# Patient Record
Sex: Male | Born: 1965 | Race: Black or African American | Hispanic: No | Marital: Married | Smoking: Never smoker
Health system: Southern US, Community
[De-identification: ages and names within clinical notes are randomized; demographics above are authoritative.]

## PROBLEM LIST (undated history)

## (undated) DIAGNOSIS — M109 Gout, unspecified: Secondary | ICD-10-CM

## (undated) HISTORY — PX: KNEE SURGERY: SHX244

## (undated) HISTORY — PX: NOSE SURGERY: SHX723

---

## 2015-05-14 ENCOUNTER — Encounter (HOSPITAL_COMMUNITY): Payer: Self-pay | Admitting: Emergency Medicine

## 2015-05-14 ENCOUNTER — Emergency Department (HOSPITAL_COMMUNITY)
Admission: EM | Admit: 2015-05-14 | Discharge: 2015-05-15 | Disposition: A | Discharge status: Home / Self Care | Payer: Self-pay | Attending: Emergency Medicine | Admitting: Emergency Medicine

## 2015-05-14 DIAGNOSIS — E860 Dehydration: Secondary | ICD-10-CM | POA: Insufficient documentation

## 2015-05-14 DIAGNOSIS — R42 Dizziness and giddiness: Secondary | ICD-10-CM

## 2015-05-14 DIAGNOSIS — Z8739 Personal history of other diseases of the musculoskeletal system and connective tissue: Secondary | ICD-10-CM | POA: Insufficient documentation

## 2015-05-14 DIAGNOSIS — M542 Cervicalgia: Secondary | ICD-10-CM | POA: Insufficient documentation

## 2015-05-14 HISTORY — DX: Gout, unspecified: M10.9

## 2015-05-14 NOTE — ED Provider Notes (Signed)
CSN: [id number]   Arrival date & time 05/14/15 2335  History  By signing my name below, I, Bethel Born, attest that this documentation has been prepared under the direction and in the presence of Shon Baton, MD. Electronically Signed: Bethel Born, ED Scribe. 05/15/2015. 5:43 AM.  Chief Complaint  Patient presents with  . Nausea  . Dizziness    HPI The history is provided by the patient. No language interpreter was used.   Pedro Bailey is a 49 y.o. male who presents to the Emergency Department complaining of intermittent dizziness (off-balance) with onset 1.5-2 weeks week ago. Denies room spinning dizziness. His most recent episode was tonight at a football game and he notes that it was the worse one so far. The sensation is worse with moving the head and transitioning from sitting to standing.  Associated symptoms include  nausea, 1 episode of emesis tonight, blurred vision, and diffuse muscle spasming. He also complains of neck stiffness that he associates with lifting weights. He saw a chiropractor last month where he had an adjustment. Over the last week he has had some sinus congestion.  Pt denies recent trauma, blurred vision, numbness, tingling, and focal weakness.   Past Medical History  Diagnosis Date  . Gout     Past Surgical History  Procedure Laterality Date  . Knee surgery    . Nose surgery      No family history on file.  Social History  Substance Use Topics  . Smoking status: Never Smoker   . Smokeless tobacco: None  . Alcohol Use: No     Review of Systems  Constitutional: Negative.  Negative for fever.  Respiratory: Negative.  Negative for chest tightness and shortness of breath.   Cardiovascular: Negative.  Negative for chest pain.  Gastrointestinal: Positive for nausea and vomiting. Negative for abdominal pain.  Genitourinary: Negative.   Musculoskeletal: Positive for neck pain. Negative for back pain.  Neurological: Positive for dizziness.  Negative for headaches.  All other systems reviewed and are negative.  Home Medications   Prior to Admission medications   Medication Sig Start Date End Date Taking? Authorizing Provider  meclizine (ANTIVERT) 50 MG tablet Take 0.5 tablets (25 mg total) by mouth 3 (three) times daily as needed for dizziness. 05/15/15   Shon Baton, MD    Allergies  Review of patient's allergies indicates no known allergies.  Triage Vitals: BP 133/80 mmHg  Pulse 67  Temp(Src) 97.7 F (36.5 C) (Oral)  Resp 15  SpO2 100%  Physical Exam  Constitutional: He is oriented to person, place, and time. He appears well-developed and well-nourished. No distress.  Muscular build  HENT:  Head: Normocephalic and atraumatic.  Mouth/Throat: Oropharynx is clear and moist.  Eyes: EOM are normal. Pupils are equal, round, and reactive to light.  Neck: Normal range of motion. Neck supple.  Tenderness to palpation of the bilateral paraspinous muscle region of the neck  Cardiovascular: Normal rate, regular rhythm and normal heart sounds.   No murmur heard. Pulmonary/Chest: Effort normal and breath sounds normal. No respiratory distress. He has no wheezes.  Abdominal: Soft. Bowel sounds are normal. There is no tenderness. There is no rebound.  Musculoskeletal: He exhibits no edema.  Lymphadenopathy:    He has no cervical adenopathy.  Neurological: He is alert and oriented to person, place, and time.  Cranial nerves II through XII intact, 5 out of 5 strength in all 4 extremities, no dysmetria to finger-nose-finger  Skin: Skin is warm  and dry.  Psychiatric: He has a normal mood and affect.  Nursing note and vitals reviewed.   ED Course  Procedures   DIAGNOSTIC STUDIES: Oxygen Saturation is 100% on RA, normal by my interpretation.    COORDINATION OF CARE: 1:05 AM Discussed treatment plan which includes lab work, EKG, and CTA of the head/neck with pt at bedside and pt agreed to plan.  Labs Reviewed  CBC  WITH DIFFERENTIAL/PLATELET - Abnormal; Notable for the following:    WBC 14.5 (*)    Neutro Abs 12.7 (*)    All other components within normal limits  BASIC METABOLIC PANEL - Abnormal; Notable for the following:    Glucose, Bld 132 (*)    Creatinine, Ser 1.29 (*)    All other components within normal limits    Imaging Review Ct Angio Head W/cm &/or Wo Cm  05/15/2015   CLINICAL DATA:  Intermittent dizziness and neck pain for 2 weeks. Acute onset dizziness and nausea while at football game tonight. Vomiting.  EXAM: CT ANGIOGRAPHY HEAD AND NECK  TECHNIQUE: Multidetector CT imaging of the head and neck was performed using the standard protocol during bolus administration of intravenous contrast. Multiplanar CT image reconstructions and MIPs were obtained to evaluate the vascular anatomy. Carotid stenosis measurements (when applicable) are obtained utilizing NASCET criteria, using the distal internal carotid diameter as the denominator.  CONTRAST:  50mL OMNIPAQUE IOHEXOL 350 MG/ML SOLN  COMPARISON:  None.  FINDINGS: CT HEAD  The ventricles and sulci are normal. No intraparenchymal hemorrhage, mass effect nor midline shift. No acute large vascular territory infarcts. No abnormal intracranial enhancement.  No abnormal extra-axial fluid collections. Basal cisterns are patent. Trace calcific atherosclerosis of the carotid siphons.  No skull fracture. The included ocular globes and orbital contents are non-suspicious. The mastoid aircells and included paranasal sinuses are well-aerated.  CTA NECK  Normal appearance of the thoracic arch, Common origin of the innominate and LEFT subclavian artery. The origins of the innominate, left Common carotid artery and subclavian artery are widely patent.  Bilateral Common carotid arteries are widely patent, coursing in a straight line fashion. Normal appearance of the carotid bifurcations without hemodynamically significant stenosis by NASCET criteria. Trace calcific  atherosclerosis the RIGHT carotid bulb. Normal appearance of the included internal carotid arteries.  LEFT vertebral artery arises directly from the aortic arch, normal variant. RIGHT vertebral artery is dominant. Normal appearance of the vertebral arteries, which appear widely patent.  No dissection, no pseudoaneurysm. No abnormal luminal irregularity. No contrast extravasation.  Soft tissues are unremarkable. 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 Moderate to severe canal stenosis at C3.  CTA HEAD  Anterior circulation: Normal appearance of the cervical internal carotid arteries, petrous, cavernous and supra clinoid internal carotid arteries. Widely patent anterior communicating artery. Normal appearance of the anterior and middle cerebral arteries.  Posterior circulation: Normal appearance of the vertebral arteries, vertebrobasilar junction and basilar artery, as well as main branch vessels. LEFT vertebral artery terminates in the posterior inferior cerebellar artery. Normal appearance of the posterior cerebral arteries. Fetal origin bilateral posterior communicating arteries.  No large vessel occlusion, hemodynamically significant stenosis, dissection, luminal irregularity, contrast extravasation or aneurysm within the anterior nor posterior circulation.  IMPRESSION: CT HEAD: No acute intracranial process ; normal noncontrast CT head. No abnormal intracranial enhancement.  CTA NECK: No hemodynamically significant stenosis or  acute vascular process.  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 Severe LEFT C6-7 neural foraminal narrowing.  CTA HEAD: No acute large vessel occlusion or high-grade stenosis. Complete circle of Willis.   Electronically Signed   By:  Awilda Metro M.D.   On: 05/15/2015 02:25   Ct Angio Neck W/cm &/or Wo/cm  05/15/2015   CLINICAL DATA:  Intermittent dizziness and neck pain for 2 weeks. Acute onset dizziness and nausea while at football game tonight. Vomiting.  EXAM: CT ANGIOGRAPHY HEAD AND NECK  TECHNIQUE: Multidetector CT imaging of the head and neck was performed using the standard protocol during bolus administration of intravenous contrast. Multiplanar CT image reconstructions and MIPs were obtained to evaluate the vascular anatomy. Carotid stenosis measurements (when applicable) are obtained utilizing NASCET criteria, using the distal internal carotid diameter as the denominator.  CONTRAST:  50mL OMNIPAQUE IOHEXOL 350 MG/ML SOLN  COMPARISON:  None.  FINDINGS: CT HEAD  The ventricles and sulci are normal. No intraparenchymal hemorrhage, mass effect nor midline shift. No acute large vascular territory infarcts. No abnormal intracranial enhancement.  No abnormal extra-axial fluid collections. Basal cisterns are patent. Trace calcific atherosclerosis of the carotid siphons.  No skull fracture. The included ocular globes and orbital contents are non-suspicious. The mastoid aircells and included paranasal sinuses are well-aerated.  CTA NECK  Normal appearance of the thoracic arch, Common origin of the innominate and LEFT subclavian artery. The origins of the innominate, left Common carotid artery and subclavian artery are widely patent.  Bilateral Common carotid arteries are widely patent, coursing in a straight line fashion. Normal appearance of the carotid bifurcations without hemodynamically significant stenosis by NASCET criteria. Trace calcific atherosclerosis the RIGHT carotid bulb. Normal appearance of the included internal carotid arteries.  LEFT vertebral artery arises directly from the aortic arch, normal variant. RIGHT vertebral artery is dominant. Normal appearance of the vertebral arteries, which appear widely patent.  No  dissection, no pseudoaneurysm. No abnormal luminal irregularity. No contrast extravasation.  Soft tissues are unremarkable. 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 Moderate to severe canal stenosis at C3.  CTA HEAD  Anterior circulation: Normal appearance of the cervical internal carotid arteries, petrous, cavernous and supra clinoid internal carotid arteries. Widely patent anterior communicating artery. Normal appearance of the anterior and middle cerebral arteries.  Posterior circulation: Normal appearance of the vertebral arteries, vertebrobasilar junction and basilar artery, as well as main branch vessels. LEFT vertebral artery terminates in the posterior inferior cerebellar artery. Normal appearance of the posterior cerebral arteries. Fetal origin bilateral posterior communicating arteries.  No large vessel occlusion, hemodynamically significant stenosis, dissection, luminal irregularity, contrast extravasation or aneurysm within the anterior nor posterior circulation.  IMPRESSION: CT HEAD: No acute intracranial process ; normal noncontrast CT head. No abnormal intracranial enhancement.  CTA NECK: No hemodynamically significant stenosis or acute vascular process.  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 Severe LEFT C6-7 neural foraminal narrowing.  CTA HEAD: No acute large vessel occlusion or high-grade stenosis. Complete circle of Willis.   Electronically Signed   By: Awilda Metro M.D.   On: 05/15/2015 02:25    EKG Interpretation  Date/Time:  Thursday May 14 2015 23:42:55 EDT Ventricular Rate:  65 PR Interval:  151 QRS Duration: 84 QT Interval:  399 QTC Calculation: 415 R Axis:   72 Text Interpretation:  Sinus rhythm Nonspecific T  abnormalities, inferior leads No prior for comparison Confirmed by HORTON  MD, COURTNEY ([id number]) on 05/15/2015 12:15:58 AM    MDM   Final diagnoses:  Dizziness  Dehydration   Patient presents with dizziness. Reports episode tonight worse with head movement, associated neck pain, and vomiting. Nontoxic on exam. Neurologically intact. Patient does report recent chiropractic visit and given neck pain, dissection would be consideration. However he is neurologically intact. Other considerations include dehydration or benign positional vertigo. Patient was given fluids. Not orthostatic. Lab work obtained and shows mildly elevated creatinine which could reflect mild dehydration. EKG shows no evidence of arrhythmia. CT imaging head and neck shows no evidence of arterial dissection.  He does have some stenosis of the spinal canal which is likely unrelated. On recheck, patient reports improvement of symptoms of fluid; however, he continues to have some dizziness. Patient was given an additional 1 L of fluids and Valium for vertigo. On recheck, patient reports resolution of symptoms. He is ambulatory at his baseline. Suspect combination of mild dehydration and BPPV.  Will discharge home with meclizine.  After history, exam, and medical workup I feel the patient has been appropriately medically screened and is safe for discharge home. Pertinent diagnoses were discussed with the patient. Patient was given return precautions.  I personally performed the services described in this documentation, which was scribed in my presence. The recorded information has been reviewed and is accurate.    Shon Baton, MD 05/15/15 [phone]

## 2015-05-14 NOTE — ED Notes (Signed)
Patient was at a football game tonight and all of a sudden started feeling dizzy and nauseated, vomited x1.  Patient states that the dizziness has been off and on for the last two weeks, the vomiting started tonight.  Patient is CAOX4.

## 2015-05-15 ENCOUNTER — Encounter (HOSPITAL_COMMUNITY): Payer: Self-pay | Admitting: Radiology

## 2015-05-15 ENCOUNTER — Emergency Department (HOSPITAL_COMMUNITY): Payer: Self-pay

## 2015-05-15 LAB — BASIC METABOLIC PANEL
ANION GAP: 11 (ref 5–15)
BUN: 10 mg/dL (ref 6–20)
CALCIUM: 9.4 mg/dL (ref 8.9–10.3)
CO2: 25 mmol/L (ref 22–32)
CREATININE: 1.29 mg/dL — AB (ref 0.61–1.24)
Chloride: 101 mmol/L (ref 101–111)
GFR calc Af Amer: >60 mL/min (ref >60)
GLUCOSE: 132 mg/dL — AB (ref 65–99)
Potassium: 4.6 mmol/L (ref 3.5–5.1)
Sodium: 137 mmol/L (ref 135–145)

## 2015-05-15 LAB — CBC WITH DIFFERENTIAL/PLATELET
BASOS ABS: 0 10*3/uL (ref 0.0–0.1)
BASOS PCT: 0 %
EOS PCT: 0 %
Eosinophils Absolute: 0 10*3/uL (ref 0.0–0.7)
HEMATOCRIT: 44 % (ref 39.0–52.0)
Hemoglobin: 15.1 g/dL (ref 13.0–17.0)
LYMPHS PCT: 8 %
Lymphs Abs: 1.2 10*3/uL (ref 0.7–4.0)
MCH: 31.3 pg (ref 26.0–34.0)
MCHC: 34.3 g/dL (ref 30.0–36.0)
MCV: 91.3 fL (ref 78.0–100.0)
MONO ABS: 0.5 10*3/uL (ref 0.1–1.0)
MONOS PCT: 4 %
NEUTROS ABS: 12.7 10*3/uL — AB (ref 1.7–7.7)
Neutrophils Relative %: 88 %
PLATELETS: 249 10*3/uL (ref 150–400)
RBC: 4.82 MIL/uL (ref 4.22–5.81)
RDW: 13.4 % (ref 11.5–15.5)
WBC: 14.5 10*3/uL — ABNORMAL HIGH (ref 4.0–10.5)

## 2015-05-15 IMAGING — CT CT ANGIO HEAD
1 of 12 series · 1 of 33 positions shown · IV contrast (Iohexol (Omnipaque 350))
Comparison: None.

CLINICAL DATA: Intermittent dizziness and neck pain for 2 weeks.
Acute onset dizziness and nausea while at football game tonight.
Vomiting.

EXAM:
CT ANGIOGRAPHY HEAD AND NECK
TECHNIQUE: Multidetector CT imaging of the head and neck was performed using
the standard protocol during bolus administration of intravenous
contrast. Multiplanar CT image reconstructions and MIPs were
obtained to evaluate the vascular anatomy. Carotid stenosis
measurements (when applicable) are obtained utilizing NASCET
criteria, using the distal internal carotid diameter as the
denominator.
CONTRAST:  50mL OMNIPAQUE IOHEXOL 350 MG/ML SOLN

[Series 300: locator · axial · 0.49mm/px · 1 of 1 slices shown]
[im 1/1  soft-tissue]
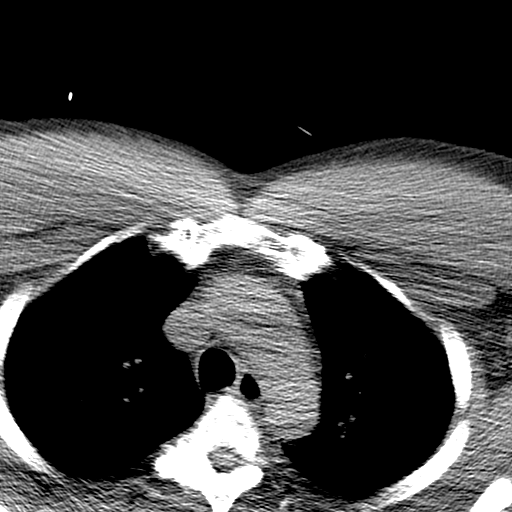

[1 of 33 positions shown; findings below may reference images not displayed]

FINDINGS: CT HEAD

The ventricles and sulci are normal. No intraparenchymal hemorrhage,
mass effect nor midline shift. No acute large vascular territory
infarcts. No abnormal intracranial enhancement.

No abnormal extra-axial fluid collections. Basal cisterns are
patent. Trace calcific atherosclerosis of the carotid siphons.

No skull fracture. The included ocular globes and orbital contents
are non-suspicious. The mastoid aircells and included paranasal
sinuses are well-aerated.

CTA NECK

Normal appearance of the thoracic arch, Common origin of the
innominate and LEFT subclavian artery. The origins of the
innominate, left Common carotid artery and subclavian artery are
widely patent.

Bilateral Common carotid arteries are widely patent, coursing in a
straight line fashion. Normal appearance of the carotid bifurcations
without hemodynamically significant stenosis by NASCET criteria.
Trace calcific atherosclerosis the RIGHT carotid bulb. Normal
appearance of the included internal carotid arteries.

LEFT vertebral artery arises directly from the aortic arch, normal
variant. RIGHT vertebral artery is dominant. Normal appearance of
the vertebral arteries, which appear widely patent.

No dissection, no pseudoaneurysm. No abnormal luminal irregularity.
No contrast extravasation.

Soft tissues are unremarkable. Multifocal ossified posterior
longitudinal ligament at the level of the vertebral bodies,
congenital canal stenosis resulting in at least moderate canal
stenosis C4 through C6-7, severe LEFT C6-7 neural foraminal
narrowing. Moderate to severe canal stenosis at C3.

CTA HEAD

Anterior circulation: Normal appearance of the cervical internal
carotid arteries, petrous, cavernous and supra clinoid internal
carotid arteries. Widely patent anterior communicating artery.
Normal appearance of the anterior and middle cerebral arteries.

Posterior circulation: Normal appearance of the vertebral arteries,
vertebrobasilar junction and basilar artery, as well as main branch
vessels. LEFT vertebral artery terminates in the posterior inferior
cerebellar artery. Normal appearance of the posterior cerebral
arteries. Fetal origin bilateral posterior communicating arteries.

No large vessel occlusion, hemodynamically significant stenosis,
dissection, luminal irregularity, contrast extravasation or aneurysm
within the anterior nor posterior circulation.
IMPRESSION: CT HEAD: No acute intracranial process ; normal noncontrast CT head.
No abnormal intracranial enhancement.

CTA NECK: No hemodynamically significant stenosis or acute vascular
process.

Segmental OPLL (ossified posterior longitudinal ligament) resulting
in moderate to severe canal stenosis at C3. Severe LEFT C6-7 neural
foraminal narrowing.

CTA HEAD: No acute large vessel occlusion or high-grade stenosis.
Complete circle of Willis.

## 2015-05-15 MED ORDER — SODIUM CHLORIDE 0.9 % IV BOLUS (SEPSIS)
1000.0000 mL | Freq: Once | INTRAVENOUS | Status: AC
Start: 2015-05-15 — End: 2015-05-15
  Administered 2015-05-15: 1000 mL via INTRAVENOUS

## 2015-05-15 MED ORDER — MECLIZINE HCL 50 MG PO TABS: 25.0000 mg | ORAL_TABLET | Freq: Three times a day (TID) | ORAL | Status: AC | PRN

## 2015-05-15 MED ORDER — DIAZEPAM 5 MG PO TABS
5.0000 mg | ORAL_TABLET | Freq: Once | ORAL | Status: AC
  Administered 2015-05-15: 5 mg via ORAL
  Filled 2015-05-15: qty 1

## 2015-05-15 MED ORDER — SODIUM CHLORIDE 0.9 % IV BOLUS (SEPSIS)
1000.0000 mL | Freq: Once | INTRAVENOUS | Status: AC
  Administered 2015-05-15: 1000 mL via INTRAVENOUS

## 2015-05-15 MED ORDER — IOHEXOL 350 MG/ML SOLN
50.0000 mL | Freq: Once | INTRAVENOUS | Status: AC | PRN
  Administered 2015-05-15: 50 mL via INTRAVENOUS

## 2015-05-15 MED ORDER — ONDANSETRON HCL 4 MG/2ML IJ SOLN
4.0000 mg | Freq: Once | INTRAMUSCULAR | Status: AC
  Administered 2015-05-15: 4 mg via INTRAVENOUS
  Filled 2015-05-15: qty 2

## 2015-05-15 NOTE — ED Notes (Signed)
Ambulated patient to bathroom. Pt was steady, but weak and reports dizziness and nausea.

## 2015-05-15 NOTE — Discharge Instructions (Signed)
You were seen today for dizziness. Some of your symptoms may be related to benign positional vertigo. However, you also appeared to be somewhat dehydrated.  Dehydration, Adult Dehydration is when you lose more fluids from the body than you take in. Vital organs like the kidneys, brain, and heart cannot function without a proper amount of fluids and salt. Any loss of fluids from the body can cause dehydration.  CAUSES   Vomiting.  Diarrhea.  Excessive sweating.  Excessive urine output.  Fever. SYMPTOMS  Mild dehydration  Thirst.  Dry lips.  Slightly dry mouth. Moderate dehydration  Very dry mouth.  Sunken eyes.  Skin does not bounce back quickly when lightly pinched and released.  Dark urine and decreased urine production.  Decreased tear production.  Headache. Severe dehydration  Very dry mouth.  Extreme thirst.  Rapid, weak pulse (more than 100 beats per minute at rest).  Cold hands and feet.  Not able to sweat in spite of heat and temperature.  Rapid breathing.  Blue lips.  Confusion and lethargy.  Difficulty being awakened.  Minimal urine production.  No tears. DIAGNOSIS  Your caregiver will diagnose dehydration based on your symptoms and your exam. Blood and urine tests will help confirm the diagnosis. The diagnostic evaluation should also identify the cause of dehydration. TREATMENT  Treatment of mild or moderate dehydration can often be done at home by increasing the amount of fluids that you drink. It is best to drink small amounts of fluid more often. Drinking too much at one time can make vomiting worse. Refer to the home care instructions below. Severe dehydration needs to be treated at the hospital where you will probably be given intravenous (IV) fluids that contain water and electrolytes. HOME CARE INSTRUCTIONS   Ask your caregiver about specific rehydration instructions.  Drink enough fluids to keep your urine clear or pale  yellow.  Drink small amounts frequently if you have nausea and vomiting.  Eat as you normally do.  Avoid:  Foods or drinks high in sugar.  Carbonated drinks.  Juice.  Extremely hot or cold fluids.  Drinks with caffeine.  Fatty, greasy foods.  Alcohol.  Tobacco.  Overeating.  Gelatin desserts.  Wash your hands well to avoid spreading bacteria and viruses.  Only take over-the-counter or prescription medicines for pain, discomfort, or fever as directed by your caregiver.  Ask your caregiver if you should continue all prescribed and over-the-counter medicines.  Keep all follow-up appointments with your caregiver. SEEK MEDICAL CARE IF:  You have abdominal pain and it increases or stays in one area (localizes).  You have a rash, stiff neck, or severe headache.  You are irritable, sleepy, or difficult to awaken.  You are weak, dizzy, or extremely thirsty. SEEK IMMEDIATE MEDICAL CARE IF:   You are unable to keep fluids down or you get worse despite treatment.  You have frequent episodes of vomiting or diarrhea.  You have blood or green matter (bile) in your vomit.  You have blood in your stool or your stool looks black and tarry.  You have not urinated in 6 to 8 hours, or you have only urinated a small amount of very dark urine.  You have a fever.  You faint. MAKE SURE YOU:   Understand these instructions.  Will watch your condition.  Will get help right away if you are not doing well or get worse. Document Released: 08/01/2005 Document Revised: 10/24/2011 Document Reviewed: 03/21/2011 [facility] Patient Information 2015 [place], [place]. This information  is not intended to replace advice given to you by your health care provider. Make sure you discuss any questions you have with your health care provider.  Benign Positional Vertigo Vertigo means you feel like you or your surroundings are moving when they are not. Benign positional vertigo is the most common  form of vertigo. Benign means that the cause of your condition is not serious. Benign positional vertigo is more common in older adults. CAUSES  Benign positional vertigo is the result of an upset in the labyrinth system. This is an area in the middle ear that helps control your balance. This may be caused by a viral infection, head injury, or repetitive motion. However, often no specific cause is found. SYMPTOMS  Symptoms of benign positional vertigo occur when you move your head or eyes in different directions. Some of the symptoms may include:  Loss of balance and falls.  Vomiting.  Blurred vision.  Dizziness.  Nausea.  Involuntary eye movements (nystagmus). DIAGNOSIS  Benign positional vertigo is usually diagnosed by physical exam. If the specific cause of your benign positional vertigo is unknown, your caregiver may perform imaging tests, such as magnetic resonance imaging (MRI) or computed tomography (CT). TREATMENT  Your caregiver may recommend movements or procedures to correct the benign positional vertigo. Medicines such as meclizine, benzodiazepines, and medicines for nausea may be used to treat your symptoms. In rare cases, if your symptoms are caused by certain conditions that affect the inner ear, you may need surgery. HOME CARE INSTRUCTIONS   Follow your caregiver's instructions.  Move slowly. Do not make sudden body or head movements.  Avoid driving.  Avoid operating heavy machinery.  Avoid performing any tasks that would be dangerous to you or others during a vertigo episode.  Drink enough fluids to keep your urine clear or pale yellow. SEEK IMMEDIATE MEDICAL CARE IF:   You develop problems with walking, weakness, numbness, or using your arms, hands, or legs.  You have difficulty speaking.  You develop severe headaches.  Your nausea or vomiting continues or gets worse.  You develop visual changes.  Your family or friends notice any behavioral  changes.  Your condition gets worse.  You have a fever.  You develop a stiff neck or sensitivity to light. MAKE SURE YOU:   Understand these instructions.  Will watch your condition.  Will get help right away if you are not doing well or get worse. Document Released: 05/09/2006 Document Revised: 10/24/2011 Document Reviewed: 04/21/2011 [facility] Patient Information 2015 [place], [place]. This information is not intended to replace advice given to you by your health care provider. Make sure you discuss any questions you have with your health care provider.
